# Patient Record
Sex: Male | Born: 1970 | Race: White | Hispanic: No | Marital: Single | Smoking: Never smoker
Health system: Southern US, Community
[De-identification: ages and names within clinical notes are randomized; demographics above are authoritative.]

## PROBLEM LIST (undated history)

## (undated) DIAGNOSIS — I10 Essential (primary) hypertension: Secondary | ICD-10-CM

## (undated) HISTORY — PX: OTHER SURGICAL HISTORY: SHX169

---

## 2011-06-14 ENCOUNTER — Inpatient Hospital Stay (INDEPENDENT_AMBULATORY_CARE_PROVIDER_SITE_OTHER)
Admission: RE | Admit: 2011-06-14 | Discharge: 2011-06-14 | Disposition: A | Discharge status: Home / Self Care | Payer: Self-pay | Source: Ambulatory Visit | Attending: Emergency Medicine | Admitting: Emergency Medicine

## 2011-06-14 DIAGNOSIS — L255 Unspecified contact dermatitis due to plants, except food: Secondary | ICD-10-CM

## 2011-07-02 ENCOUNTER — Inpatient Hospital Stay (INDEPENDENT_AMBULATORY_CARE_PROVIDER_SITE_OTHER)
Admission: RE | Admit: 2011-07-02 | Discharge: 2011-07-02 | Disposition: A | Discharge status: Home / Self Care | Payer: Self-pay | Source: Ambulatory Visit | Attending: Emergency Medicine | Admitting: Emergency Medicine

## 2011-07-02 DIAGNOSIS — E119 Type 2 diabetes mellitus without complications: Secondary | ICD-10-CM

## 2011-07-02 LAB — POCT I-STAT, CHEM 8
Chloride: 100 mEq/L (ref 96–112)
Glucose, Bld: 315 mg/dL — ABNORMAL HIGH (ref 70–99)
HCT: 50 % (ref 39.0–52.0)
Potassium: 4.2 mEq/L (ref 3.5–5.1)

## 2012-07-16 ENCOUNTER — Encounter (HOSPITAL_COMMUNITY): Payer: Self-pay | Admitting: Emergency Medicine

## 2012-07-16 ENCOUNTER — Emergency Department (HOSPITAL_COMMUNITY)
Admission: EM | Admit: 2012-07-16 | Discharge: 2012-07-16 | Disposition: A | Discharge status: Home / Self Care | Payer: 59 | Source: Home / Self Care | Attending: Emergency Medicine | Admitting: Emergency Medicine

## 2012-07-16 DIAGNOSIS — L259 Unspecified contact dermatitis, unspecified cause: Secondary | ICD-10-CM

## 2012-07-16 HISTORY — DX: Essential (primary) hypertension: I10

## 2012-07-16 MED ORDER — ACIDOPHILUS PROBIOTIC BLEND PO CAPS: 2.0000 | ORAL_CAPSULE | Freq: Two times a day (BID) | ORAL | Status: AC

## 2012-07-16 MED ORDER — FAMOTIDINE 20 MG PO TABS: 20.0000 mg | ORAL_TABLET | Freq: Two times a day (BID) | ORAL | Status: AC

## 2012-07-16 MED ORDER — TRIAMCINOLONE ACETONIDE 0.1 % EX CREA: TOPICAL_CREAM | Freq: Two times a day (BID) | CUTANEOUS | Status: AC

## 2012-07-16 MED ORDER — LORATADINE 10 MG PO TABS: 10.0000 mg | ORAL_TABLET | Freq: Every day | ORAL | Status: AC

## 2012-07-16 MED ORDER — CEPHALEXIN 500 MG PO CAPS: 500.0000 mg | ORAL_CAPSULE | Freq: Four times a day (QID) | ORAL | Status: AC

## 2012-07-16 NOTE — ED Provider Notes (Signed)
History     CSN: [id number]  Arrival date & time 07/16/12  1209   First MD Initiated Contact with Patient 07/16/12 1212      Chief Complaint  Patient presents with  . Rash    (Consider location/radiation/quality/duration/timing/severity/associated sxs/prior treatment) HPI Comments: Patient reports facial swelling, lip swelling, itchy, erythematous, vesicular, linear rash over bilateral arms starting after he weeded and clearing his yard of  [place]  and other plant debris. States that he burned and agree, and noted facial swelling, lip swelling started shortly thereafter. States that he's been taking Benadryl a regular basis with improvement in the facial swelling, but notes that the rash on his arms is now weeping and crusting over. No fevers. There is no rash on his torso, legs. He is diabetic, and states that his glucose has been running within normal limits for him. Also has a history of stage I hypertension, which he is trying to control with diet and exercise. Does not take any medicines for it.   ROS as noted in HPI. All other ROS negative.   Patient is a 41 y.o. male presenting with rash. The history is provided by the patient. No language interpreter was used.  Rash  This is a new problem. The current episode started 2 days ago. The problem has been gradually improving. The problem is associated with plant contact. There has been no fever. The rash is present on the face, left arm and right arm. The pain has been improving since onset. Associated symptoms include blisters, itching and weeping. He has tried antihistamines for the symptoms. The treatment provided moderate relief. Risk factors include new environmental exposures.    Past Medical History  Diagnosis Date  . Diabetes mellitus   . Hypertension     Past Surgical History  Procedure Date  . Scoliosis     History reviewed. No pertinent family history.  History  Substance Use Topics  . Smoking status: Never Smoker     . Smokeless tobacco: Not on file  . Alcohol Use: Yes      Review of Systems  Constitutional: Negative for fever.  HENT: Negative for sore throat, trouble swallowing and voice change.   Skin: Positive for itching and rash.    Allergies  Solu-medrol  Home Medications   Current Outpatient Rx  Name Route Sig Dispense Refill  . GLIPIZIDE 10 MG PO TABS Oral Take 10 mg by mouth 2 (two) times daily before a meal.    . METFORMIN HCL 1000 MG PO TABS Oral Take 1,000 mg by mouth 2 (two) times daily with a meal.    . CEPHALEXIN 500 MG PO CAPS Oral Take 1 capsule (500 mg total) by mouth 4 (four) times daily. X 10 days 40 capsule 0  . FAMOTIDINE 20 MG PO TABS Oral Take 1 tablet (20 mg total) by mouth 2 (two) times daily. 10 tablet 0  . LORATADINE 10 MG PO TABS Oral Take 1 tablet (10 mg total) by mouth daily. 10 tablet 0  . ACIDOPHILUS PROBIOTIC BLEND PO CAPS Oral Take 2 capsules by mouth 2 (two) times daily. 60 capsule 0  . TRIAMCINOLONE ACETONIDE 0.1 % EX CREA Topical Apply topically 2 (two) times daily. Apply for 2 weeks. May use on face 30 g 0    BP 168/101  Pulse 93  Temp 98.5 F (36.9 C) (Oral)  Resp 18  SpO2 99%  Physical Exam  Nursing note and vitals reviewed. Constitutional: He is oriented to person, place,  and time. He appears well-developed and well-nourished.  HENT:  Head: Normocephalic and atraumatic.       Airway widely patent no angioedema  Eyes: Conjunctivae and EOM are normal.  Neck: Normal range of motion.  Cardiovascular: Normal rate.   Pulmonary/Chest: Effort normal. No respiratory distress.  Abdominal: He exhibits no distension.  Musculoskeletal: Normal range of motion.  Neurological: He is alert and oriented to person, place, and time. Coordination normal.  Skin: Skin is warm and dry. Rash noted. Rash is vesicular.       Erythematous splotches, linear vesicular rash over bilateral arms consistent with contact dermatitis. Large nontender weeping area on left  forearm, with yellowish crusting. A slightly erythematous, no lesions noted.  Psychiatric: He has a normal mood and affect. His behavior is normal. Judgment and thought content normal.    ED Course  Procedures (including critical care time)  Labs Reviewed - No data to display No results found.   1. Contact dermatitis     MDM  Previous charts reviewed.   Identical presentation last August, and was treated for poison ivy, given Solu-Medrol. He had a syncopal episode post injection, blood pressure, fingerstick was normal at that time. He had no swelling, difficulty breathing, wheezing post injection. It was thought that he had a vasovagal reaction, rather than a true anaphylactic reaction. He was sent home on Atarax, prednisone. Presented 2 weeks later with hyperglycemia, was diagnosed with diabetes, sent home with glipizide & metformin which patient states he is taking regularly. Patient admits that he did not disclose diagnosis of diabetes on his first visit.  Will start triamcinolone, Pepcid for poison ivy, and have him start a nonsedating antihistamine. This episode is not as severe as it was last year. deferring systemic steroids at this time due to his diabetes. Will send home on some Keflex as patient is at high risk for infection.  Blood pressure noted, patient is otherwise asymptomatic. Appears somewhat anxious here. He will follow this at home. Referring primary care resources for ongoing management.  Luiz Blare, MD 07/16/12 [phone]

## 2012-07-16 NOTE — ED Notes (Signed)
Patient has a minor child with him being seen in the department today

## 2012-07-16 NOTE — ED Notes (Signed)
Onset 2 days ago of rash.  Reports rash to face and both arms, denies any rash to chest.  Reports areas are itchy.  Reports face was red, swollen, tight skin.  Reports face is improving

## 2012-10-13 ENCOUNTER — Emergency Department (HOSPITAL_COMMUNITY)
Admission: EM | Admit: 2012-10-13 | Discharge: 2012-10-13 | Disposition: A | Discharge status: Home / Self Care | Payer: 59 | Source: Home / Self Care | Attending: Emergency Medicine | Admitting: Emergency Medicine

## 2012-10-13 ENCOUNTER — Encounter (HOSPITAL_COMMUNITY): Payer: Self-pay

## 2012-10-13 DIAGNOSIS — B86 Scabies: Secondary | ICD-10-CM

## 2012-10-13 MED ORDER — IVERMECTIN 3 MG PO TABS: ORAL_TABLET | ORAL | Status: AC

## 2012-10-13 NOTE — ED Notes (Signed)
Known scabies, wont go away

## 2012-10-13 NOTE — ED Provider Notes (Signed)
Chief Complaint  Patient presents with  . Skin Problem    History of Present Illness:   The patient is a 41 year old male who has had a one-month history of scabies. He first went to his primary care physician and was given a course of the permethrin. 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 The scabies never completely went away. He took 2 additional courses of permethrin, but still has a rash. He states in the meantime he is an infected several family members. The rash is mainly involves the hands and arms but also on the abdomen as well. He denies any rash elsewhere a swelling of lips, throat, tongue or difficulty breathing.  Review of Systems:  Other than noted above, the patient denies any of the following symptoms: Systemic:  No fever, chills, sweats, weight loss, or fatigue. ENT:  No nasal congestion, rhinorrhea, sore throat, swelling of lips, tongue or throat. Resp:  No cough, wheezing, or shortness of breath. Skin:  No rash, itching, nodules, or suspicious lesions.  PMFSH:  Past medical history, family history, social history, meds, and allergies were reviewed.  Physical Exam:   Vital signs:  BP 145/98  Pulse 93  Temp 98.3 F (36.8 C) (Oral)  Resp 18  SpO2 98% Gen:  Alert, oriented, in no distress. ENT:  Pharynx clear, no intraoral lesions, moist mucous membranes. Lungs:  Clear to auscultation. Skin:  He has scattered erythematous maculopapules on his hands, fingers, web spaces, forearms, upper arms, and abdomen.  Assessment:  The encounter diagnosis was Scabies.  Plan:   1.  The following meds were prescribed:   New Prescriptions   IVERMECTIN (STROMECTOL) 3 MG TABS    Take 8 tabs all at one time.   2.  The patient was instructed in symptomatic care and handouts were given. 3.  The patient was told to return if becoming worse in any way, if no better in 3 or 4 days, and given some red flag symptoms that would indicate earlier  return.     Reuben Likes, MD 10/13/12 [phone]

## 2014-01-22 ENCOUNTER — Encounter (HOSPITAL_BASED_OUTPATIENT_CLINIC_OR_DEPARTMENT_OTHER): Payer: BC Managed Care – PPO | Attending: General Surgery

## 2014-01-22 ENCOUNTER — Other Ambulatory Visit (HOSPITAL_BASED_OUTPATIENT_CLINIC_OR_DEPARTMENT_OTHER): Payer: Self-pay | Admitting: General Surgery

## 2014-01-22 ENCOUNTER — Ambulatory Visit (HOSPITAL_COMMUNITY)
Admission: RE | Admit: 2014-01-22 | Discharge: 2014-01-22 | Disposition: A | Discharge status: Home / Self Care | Payer: BC Managed Care – PPO | Source: Ambulatory Visit | Attending: General Surgery | Admitting: General Surgery

## 2014-01-22 DIAGNOSIS — M869 Osteomyelitis, unspecified: Secondary | ICD-10-CM

## 2014-01-22 DIAGNOSIS — G5790 Unspecified mononeuropathy of unspecified lower limb: Secondary | ICD-10-CM | POA: Insufficient documentation

## 2014-01-22 DIAGNOSIS — L97509 Non-pressure chronic ulcer of other part of unspecified foot with unspecified severity: Secondary | ICD-10-CM | POA: Insufficient documentation

## 2014-01-22 DIAGNOSIS — E1169 Type 2 diabetes mellitus with other specified complication: Secondary | ICD-10-CM | POA: Insufficient documentation

## 2014-01-22 DIAGNOSIS — Z7982 Long term (current) use of aspirin: Secondary | ICD-10-CM | POA: Insufficient documentation

## 2014-01-22 DIAGNOSIS — I10 Essential (primary) hypertension: Secondary | ICD-10-CM | POA: Insufficient documentation

## 2014-01-22 DIAGNOSIS — K219 Gastro-esophageal reflux disease without esophagitis: Secondary | ICD-10-CM | POA: Insufficient documentation

## 2014-01-22 DIAGNOSIS — Z79899 Other long term (current) drug therapy: Secondary | ICD-10-CM | POA: Insufficient documentation

## 2014-01-23 NOTE — H&P (Signed)
NAMLawrence Marquez:  Gabriel Marquez, Gabriel Marquez              ACCOUNT NO.:  [id number]  MEDICAL RECORD NO.:  [id number]  LOCATION:  FOOT                         FACILITY:  [facility]  PHYSICIAN:  Joanne Gaveloy Ashyah Quizon, M.D.        DATE OF BIRTH:  1971/10/07  DATE OF ADMISSION:  01/22/2014 DATE OF DISCHARGE:                             HISTORY & PHYSICAL   CHIEF COMPLAINT:  Wound, right great toe.  HISTORY OF PRESENT ILLNESS:  This is a 43 year old diabetic male, diabetic for quite a while, who banged his foot and developed a large callus and ulceration of the right great toe.  PAST MEDICAL HISTORY:  Significant for diabetes, GERD, juvenile epilepsy.  He has not had a seizure in 20 years.  Hemorrhoids, sinusitis, scoliosis, and hypertension.  PAST SURGICAL HISTORY:  Back surgery at age 43 and triple arthrodesis of the ankle at age 43.  SOCIAL HISTORY:  Cigarettes none.  Alcohol none.  MEDICATIONS:  Pepcid, glipizide, Stromectol, Claritin, Glucophage, lisinopril, and daily aspirin.  REVIEW OF SYSTEMS:  As above.  PHYSICAL EXAMINATION:  VITAL SIGNS:  Temperature 98.4, pulse 72, respirations 16, blood pressure 125/80.  Glucose is 130. GENERAL APPEARANCE:  Well developed, well nourished, in no distress. CHEST:  Clear. HEART:  Regular rhythm. EXTREMITIES:  Examination of the lower extremities reveals an ABI of 1.4.  Peripheral pulses are easily palpable.  There was a callus 2.0 x 2.7.  After debridement, there was a small wound 0.4 x 0.4 x 0.1. 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  IMPRESSION:  Diabetic foot ulcer, Wagner 2.  We will start treatment with Silver collagen and padding and a DARCO shoe.  If we do not get rapid healing, we will go and do contact casting.     Joanne Gaveloy Jocob Dambach, M.D.     RA/MEDQ  D:  01/22/2014  T:  01/23/2014  Job:  [id number]

## 2014-02-12 ENCOUNTER — Encounter (HOSPITAL_BASED_OUTPATIENT_CLINIC_OR_DEPARTMENT_OTHER): Payer: BC Managed Care – PPO | Attending: General Surgery

## 2015-01-16 IMAGING — CR DG TOE GREAT 2+V*R*
4 series · 4 of 4 positions shown · non-contrast
Comparison: None.

CLINICAL DATA: Right great toe ulcer.

EXAM:
RIGHT GREAT TOE

[t toes ap right]
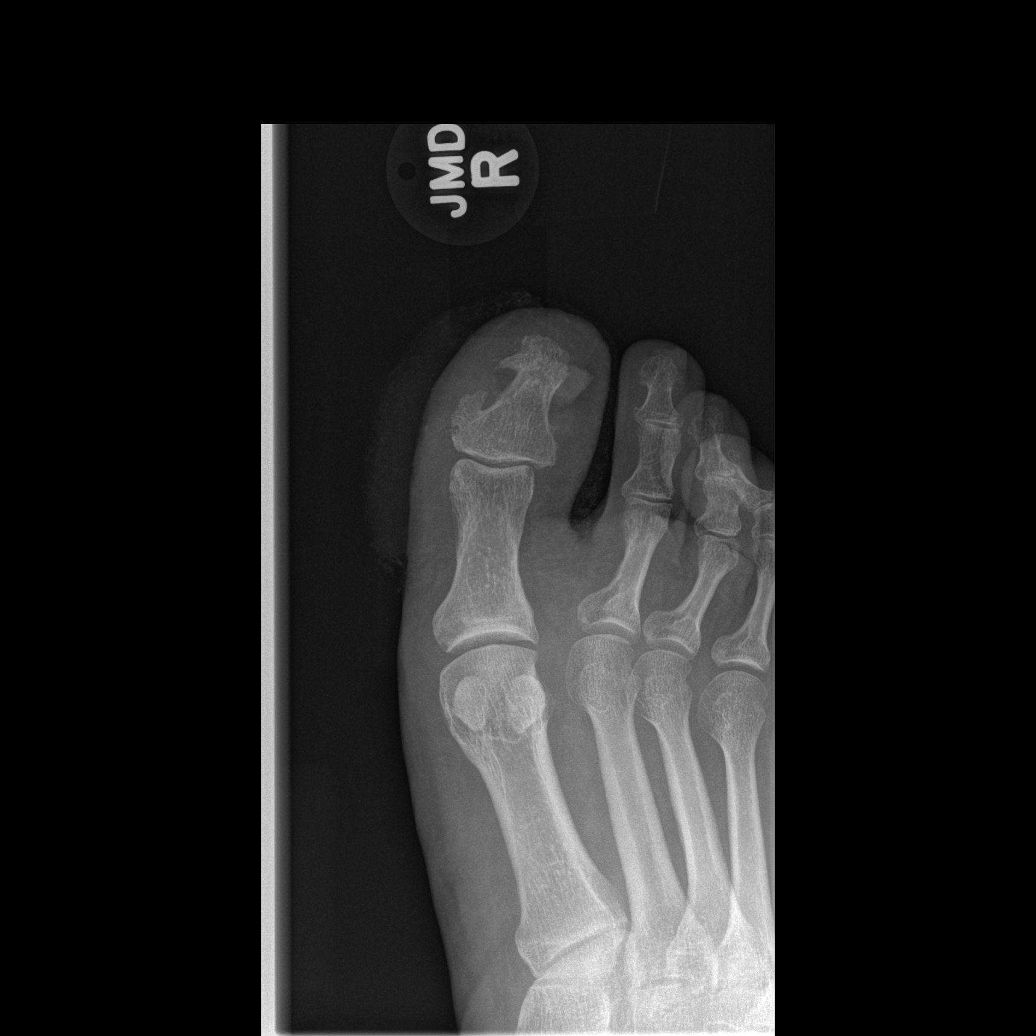

[t toes oblique right]
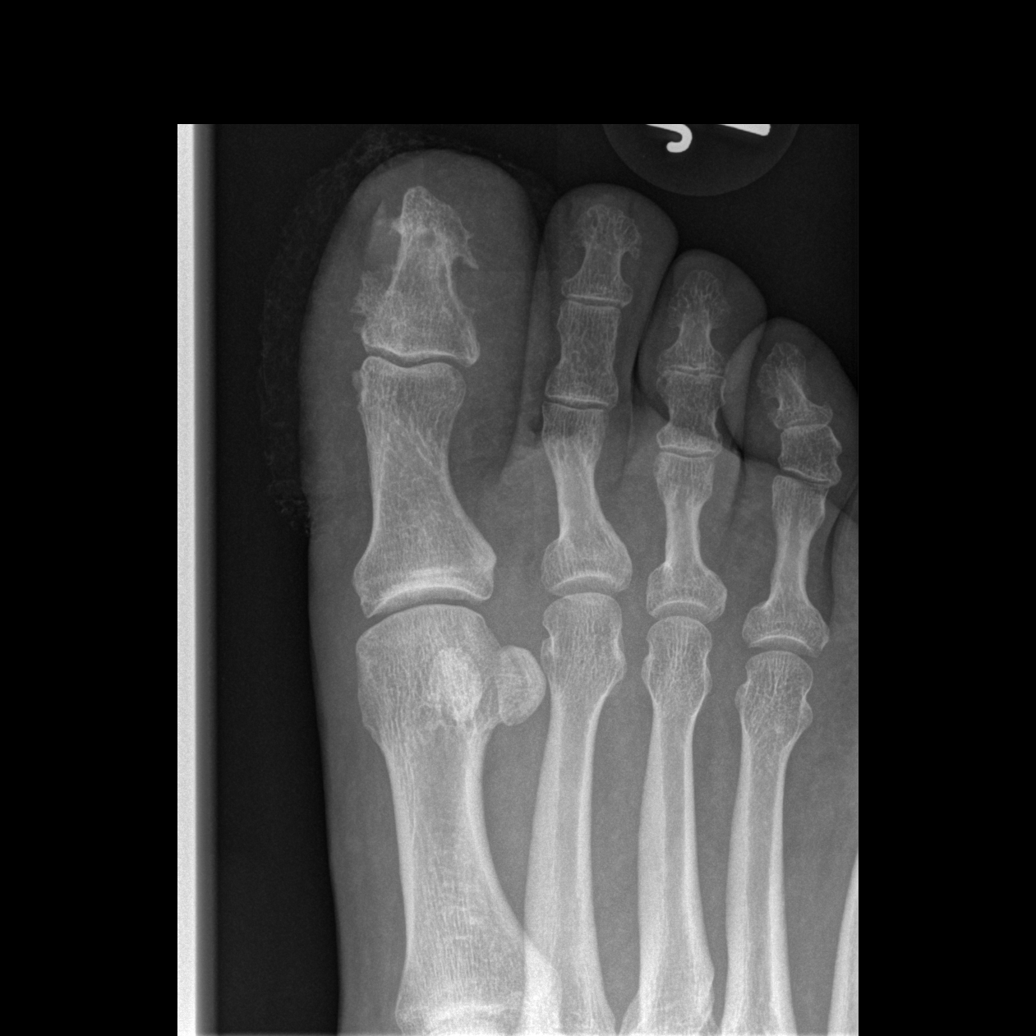

[t toes lateral right *]
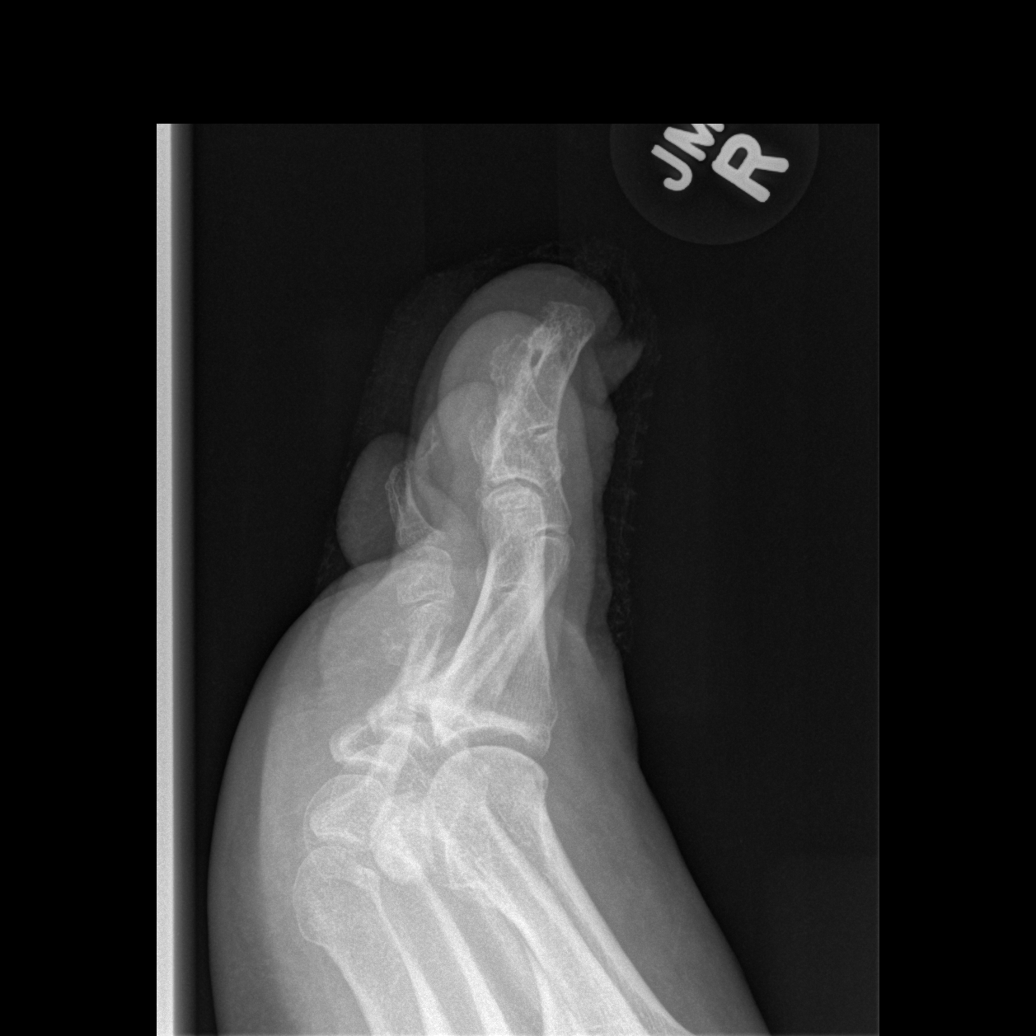

[t toes lateral right]
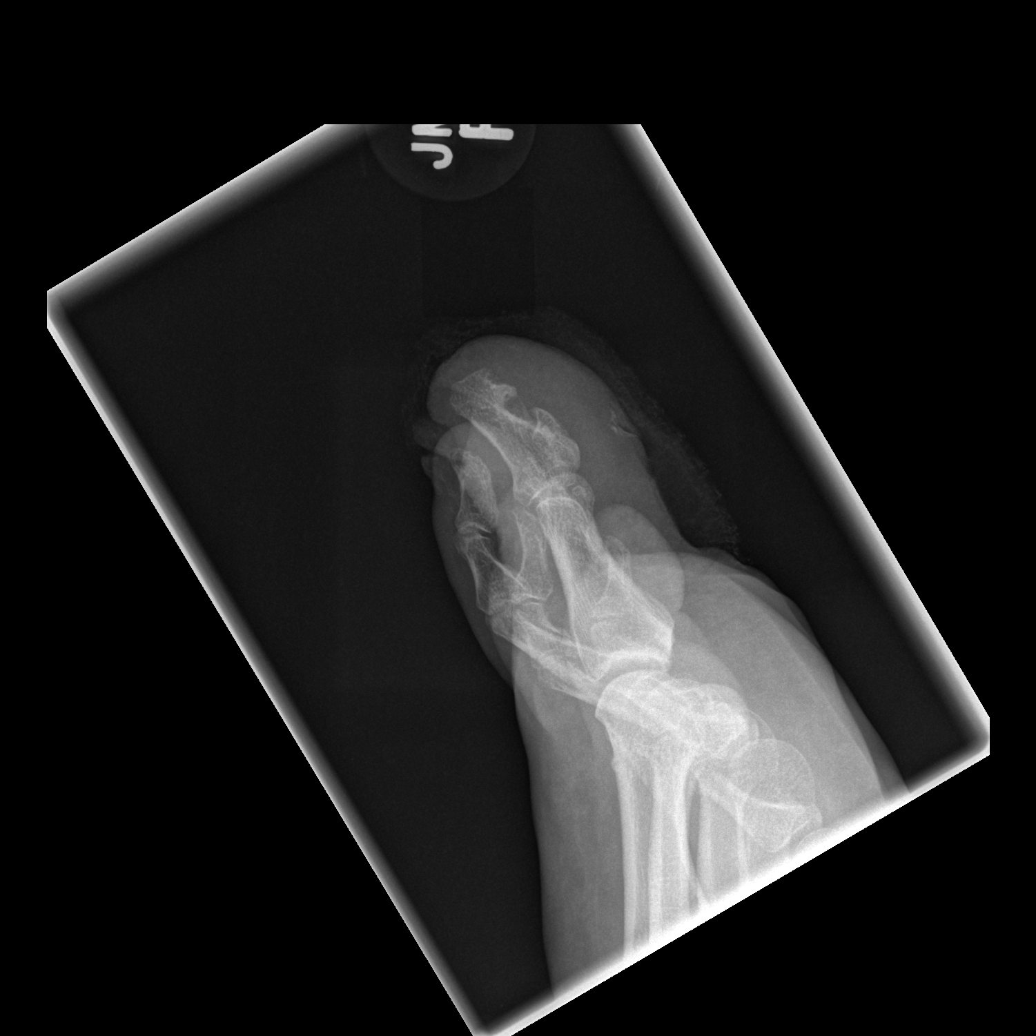

[4 of 4 positions shown; findings below may reference images not displayed]

FINDINGS: There is no evidence of fracture or dislocation. There is no
evidence of arthropathy or other focal bone abnormality. Soft tissue
ulceration is seen along the plantar tissues of the great toe. No
lytic destruction is seen to suggest osteomyelitis.
IMPRESSION: No definite evidence of osteomyelitis.
